# Patient Record
Sex: Female | Born: 1970 | Race: Black or African American | Hispanic: No | Marital: Married | Smoking: Never smoker
Health system: Southern US, Community
[De-identification: ages and names within clinical notes are randomized; demographics above are authoritative.]

## PROBLEM LIST (undated history)

## (undated) DIAGNOSIS — D219 Benign neoplasm of connective and other soft tissue, unspecified: Secondary | ICD-10-CM

## (undated) HISTORY — DX: Benign neoplasm of connective and other soft tissue, unspecified: D21.9

## (undated) HISTORY — PX: ABDOMINAL HYSTERECTOMY: SHX81

---

## 2009-06-13 ENCOUNTER — Encounter: Admission: RE | Admit: 2009-06-13 | Discharge: 2009-06-13 | Payer: Self-pay | Admitting: Infectious Diseases

## 2011-10-26 ENCOUNTER — Ambulatory Visit (INDEPENDENT_AMBULATORY_CARE_PROVIDER_SITE_OTHER): Payer: 59 | Admitting: Family Medicine

## 2011-10-26 VITALS — BP 121/72 | HR 80 | Temp 98.3°F | Resp 16 | Ht 65.5 in | Wt 167.0 lb

## 2011-10-26 DIAGNOSIS — J069 Acute upper respiratory infection, unspecified: Secondary | ICD-10-CM

## 2011-10-26 DIAGNOSIS — M25562 Pain in left knee: Secondary | ICD-10-CM

## 2011-10-26 DIAGNOSIS — M25561 Pain in right knee: Secondary | ICD-10-CM

## 2011-10-26 DIAGNOSIS — M25569 Pain in unspecified knee: Secondary | ICD-10-CM

## 2011-10-26 LAB — POCT CBC
Granulocyte percent: 40.7 %G (ref 37–80)
HCT, POC: 38.2 % (ref 37.7–47.9)
Hemoglobin: 11.6 g/dL — AB (ref 12.2–16.2)
Lymph, poc: 3.5 — AB (ref 0.6–3.4)
MCH, POC: 28.6 pg (ref 27–31.2)
MCHC: 30.4 g/dL — AB (ref 31.8–35.4)
MCV: 94.1 fL (ref 80–97)
MID (cbc): 0.5 (ref 0–0.9)
MPV: 8.8 fL (ref 0–99.8)
POC Granulocyte: 2.7 (ref 2–6.9)
POC LYMPH PERCENT: 52.3 %L — AB (ref 10–50)
POC MID %: 7 %M (ref 0–12)
Platelet Count, POC: 391 10*3/uL (ref 142–424)
RBC: 4.06 M/uL (ref 4.04–5.48)
RDW, POC: 13.4 %
WBC: 6.7 10*3/uL (ref 4.6–10.2)

## 2011-10-26 LAB — COMPREHENSIVE METABOLIC PANEL
ALT: <8 U/L (ref 0–35)
AST: 11 U/L (ref 0–37)
Albumin: 4.2 g/dL (ref 3.5–5.2)
Alkaline Phosphatase: 66 U/L (ref 39–117)
BUN: 7 mg/dL (ref 6–23)
CO2: 26 mEq/L (ref 19–32)
Calcium: 9 mg/dL (ref 8.4–10.5)
Chloride: 104 mEq/L (ref 96–112)
Creat: 0.71 mg/dL (ref 0.50–1.10)
Glucose, Bld: 88 mg/dL (ref 70–99)
Potassium: 4.2 mEq/L (ref 3.5–5.3)
Sodium: 138 mEq/L (ref 135–145)
Total Bilirubin: 0.5 mg/dL (ref 0.3–1.2)
Total Protein: 7.2 g/dL (ref 6.0–8.3)

## 2011-10-26 LAB — RHEUMATOID FACTOR: Rhuematoid fact SerPl-aCnc: <10 IU/mL (ref <=14)

## 2011-10-26 LAB — POCT SEDIMENTATION RATE: POCT SED RATE: 33 mm/hr — AB (ref 0–22)

## 2011-10-26 MED ORDER — AZITHROMYCIN 250 MG PO TABS: ORAL_TABLET | ORAL | Status: AC

## 2011-10-26 MED ORDER — PREDNISONE 20 MG PO TABS: ORAL_TABLET | ORAL | Status: AC

## 2011-10-26 NOTE — Progress Notes (Signed)
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 Pain is unrelieved by ibuprofen. She's had no swelling or other joint pain. She has no history of arthritis. She's had no falls or trauma. She does soothe her job involves a good amount of walking checking on doors and locks. She's had no sore throat or rash  Patient also complains about recent cough and sinus congestion. This is been going on a week. Said no fever or ear pain. Said no loss of hearing or trouble swallowing. He has no GERD symptoms  Patient does have a family history of osteoarthritis in her mother.  Objective: No acute distress The exam shows no ligamentous tenderness, swelling, effusion, erythema, or localized tenderness. She has full range of motion in her ligaments. She does have some fine crepitus.  HEENT is unremarkable Chest: Normal heart sounds, few rhonchi bilaterally with no rales  Assessment: URI and wear-and-tear kind of arthritis  1. URI, acute  azithromycin (ZITHROMAX Z-PAK) 250 MG tablet  2. Knee pain, bilateral  predniSONE (DELTASONE) 20 MG tablet, POCT CBC, POCT SEDIMENTATION RATE, Rheumatoid factor

## 2011-10-26 NOTE — Addendum Note (Signed)
Addended by: Honor Loh on: 10/26/2011 10:31 AM   Modules accepted: Orders

## 2019-06-02 ENCOUNTER — Encounter: Payer: Self-pay | Admitting: Advanced Practice Midwife

## 2019-06-02 ENCOUNTER — Other Ambulatory Visit: Payer: Self-pay

## 2019-06-02 ENCOUNTER — Ambulatory Visit (INDEPENDENT_AMBULATORY_CARE_PROVIDER_SITE_OTHER): Payer: No Typology Code available for payment source | Admitting: Advanced Practice Midwife

## 2019-06-02 ENCOUNTER — Other Ambulatory Visit (HOSPITAL_COMMUNITY)
Admission: RE | Admit: 2019-06-02 | Discharge: 2019-06-02 | Disposition: A | Discharge status: Home / Self Care | Payer: No Typology Code available for payment source | Source: Ambulatory Visit | Attending: Advanced Practice Midwife | Admitting: Advanced Practice Midwife

## 2019-06-02 VITALS — BP 130/75 | HR 86 | Temp 98.4°F | Resp 16 | Ht 65.0 in | Wt 164.0 lb

## 2019-06-02 DIAGNOSIS — N898 Other specified noninflammatory disorders of vagina: Secondary | ICD-10-CM

## 2019-06-02 DIAGNOSIS — Z90711 Acquired absence of uterus with remaining cervical stump: Secondary | ICD-10-CM | POA: Diagnosis not present

## 2019-06-02 DIAGNOSIS — R300 Dysuria: Secondary | ICD-10-CM | POA: Diagnosis not present

## 2019-06-02 DIAGNOSIS — Z01419 Encounter for gynecological examination (general) (routine) without abnormal findings: Secondary | ICD-10-CM | POA: Diagnosis not present

## 2019-06-02 NOTE — Progress Notes (Signed)
Subjective:     Barbara Brown is a 49 y.o. female here at [facility] for a routine exam.  Current complaints: onset of vaginal irritation 3-4 days ago following change in soap and laundry detergent. There is no itching, discharge, or odor, but there is burning. There is burning with urination also.  There are no other urinary or other symptoms.  She had hysterectomy 15+ years ago and reports having Pap every year with her usual Ob/Gyn.  She has never had an abnormal Pap.  Personal health questionnaire reviewed: yes.  Do you have a primary care provider? yes  Do you feel safe at home? yes Has anyone hit, slapped, or kicked you recently? no Do you feel sad, tired, or upset most days or are you mostly happy with life? mostly happy    Gynecologic History No LMP recorded. Patient has had a hysterectomy. Contraception: status post hysterectomy Last Pap: 02/2018. Results were: normal Last mammogram: 2020. Results were: normal  Obstetric History OB History  Gravida Para Term Preterm AB Living  1 1 1     1   SAB TAB Ectopic Multiple Live Births               # Outcome Date GA Lbr Len/2nd Weight Sex Delivery Anes PTL Lv  1 Term              The following portions of the patient's history were reviewed and updated as appropriate: allergies, current medications, past family history, past medical history, past social history, past surgical history and problem list.  Review of Systems Pertinent items noted in HPI and remainder of comprehensive ROS otherwise negative.    Objective:   BP 130/75   Pulse 86   Temp 98.4 F (36.9 C)   Resp 16   Ht 5\' 5"  (1.651 m)   Wt 164 lb (74.4 kg)   BMI 27.29 kg/m    VS reviewed, nursing note reviewed,  Constitutional: well developed, well nourished, no distress HEENT: normocephalic CV: normal rate Pulm/chest wall: normal effort Breast Exam:  right breast normal without mass, skin or nipple changes or axillary nodes, left breast normal without  mass, skin or nipple changes or axillary nodes Abdomen: soft Neuro: alert and oriented x 3 Skin: warm, dry Psych: affect normal Pelvic exam: Cervix pink, visually closed, without lesion, scant white creamy discharge, vaginal walls and external genitalia normal Bimanual exam: Cervix 0/long/high, firm, anterior, neg CMT, uterus nontender, nonenlarged, adnexa without tenderness, enlargement, or mass     Assessment/Plan:   1. Well woman exam with routine gynecological exam --Doing well, no concerns other than recent vaginal irritation. Not currently sexually active.  Changed vaginal wash and laundry detergent this week. Denies any hx of vaginal infections like yeast or BV x 20+ years. --Pt with Paps annually prior to today. Discussed current recommendations, with pap Q 5 years with cotesting.  Pt would llike to do Pap today since we are doing pelvic for her symptoms, then consider going longer between testing if normal results. --Pt unsure of hysterectomy hx but recurrent Pap tests make partial hysterectomy most likely.  Partial cervix visible on speculum exam but no length of cervix palpable on bimanual.  - MM DIGITAL SCREENING BILATERAL; Future - Cytology - PAP( [place])  2. Vaginal irritation --No edema, erythema, or discharge on exam --Pt to use unscented soaps, less soap in vaginal area, and may use topical hydrocortisone PRN --Vaginal swabs pending --No clinical evidence of atrophy but may be  contributing in 49 yo pt s/p hysterectomy --F/U with office if symptoms persist, consider steroid ointment if irritation without infection persists - Cervicovaginal ancillary only( [place])  3. Dysuria  - Urinalysis, Routine w reflex microscopic      Follow up in: 1 year or as needed.   Fatima Blank, CNM 4:18 PM

## 2019-06-03 LAB — URINALYSIS, ROUTINE W REFLEX MICROSCOPIC
Bacteria, UA: NONE SEEN /HPF
Bilirubin Urine: NEGATIVE
Glucose, UA: NEGATIVE
Hgb urine dipstick: NEGATIVE
Hyaline Cast: NONE SEEN /LPF
Ketones, ur: NEGATIVE
Leukocytes,Ua: NEGATIVE
Nitrite: NEGATIVE
Protein, ur: NEGATIVE
Specific Gravity, Urine: 1.023 (ref 1.001–1.03)
WBC, UA: NONE SEEN /HPF (ref 0–5)
pH: 7 (ref 5.0–8.0)

## 2019-06-03 LAB — CERVICOVAGINAL ANCILLARY ONLY
Bacterial Vaginitis (gardnerella): NEGATIVE
Candida Glabrata: NEGATIVE
Candida Vaginitis: NEGATIVE
Chlamydia: NEGATIVE
Comment: NEGATIVE
Comment: NEGATIVE
Comment: NEGATIVE
Comment: NEGATIVE
Comment: NEGATIVE
Comment: NORMAL
Neisseria Gonorrhea: NEGATIVE
Trichomonas: NEGATIVE

## 2019-06-04 LAB — CYTOLOGY - PAP
Comment: NEGATIVE
Diagnosis: NEGATIVE
High risk HPV: POSITIVE — AB

## 2019-06-06 ENCOUNTER — Encounter: Payer: Self-pay | Admitting: Advanced Practice Midwife

## 2019-06-06 DIAGNOSIS — R87810 Cervical high risk human papillomavirus (HPV) DNA test positive: Secondary | ICD-10-CM | POA: Insufficient documentation

## 2019-06-09 ENCOUNTER — Telehealth: Payer: Self-pay | Admitting: *Deleted

## 2019-06-09 NOTE — Telephone Encounter (Signed)
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

## 2019-06-10 ENCOUNTER — Other Ambulatory Visit: Payer: Self-pay

## 2019-06-10 ENCOUNTER — Ambulatory Visit (INDEPENDENT_AMBULATORY_CARE_PROVIDER_SITE_OTHER): Payer: No Typology Code available for payment source

## 2019-06-10 DIAGNOSIS — Z01419 Encounter for gynecological examination (general) (routine) without abnormal findings: Secondary | ICD-10-CM | POA: Diagnosis not present

## 2019-06-11 ENCOUNTER — Other Ambulatory Visit: Payer: Self-pay | Admitting: Advanced Practice Midwife

## 2019-06-11 DIAGNOSIS — R928 Other abnormal and inconclusive findings on diagnostic imaging of breast: Secondary | ICD-10-CM

## 2019-06-26 ENCOUNTER — Other Ambulatory Visit: Payer: No Typology Code available for payment source

## 2019-06-29 ENCOUNTER — Ambulatory Visit
Admission: RE | Admit: 2019-06-29 | Discharge: 2019-06-29 | Disposition: A | Discharge status: Home / Self Care | Payer: No Typology Code available for payment source | Source: Ambulatory Visit | Attending: Advanced Practice Midwife | Admitting: Advanced Practice Midwife

## 2019-06-29 ENCOUNTER — Other Ambulatory Visit: Payer: Self-pay

## 2019-06-29 ENCOUNTER — Other Ambulatory Visit: Payer: Self-pay | Admitting: Advanced Practice Midwife

## 2019-06-29 DIAGNOSIS — R928 Other abnormal and inconclusive findings on diagnostic imaging of breast: Secondary | ICD-10-CM

## 2019-06-30 ENCOUNTER — Ambulatory Visit
Admission: RE | Admit: 2019-06-30 | Discharge: 2019-06-30 | Disposition: A | Discharge status: Home / Self Care | Payer: No Typology Code available for payment source | Source: Ambulatory Visit | Attending: Advanced Practice Midwife | Admitting: Advanced Practice Midwife

## 2019-06-30 ENCOUNTER — Other Ambulatory Visit: Payer: Self-pay

## 2019-06-30 DIAGNOSIS — R928 Other abnormal and inconclusive findings on diagnostic imaging of breast: Secondary | ICD-10-CM

## 2021-02-19 IMAGING — MG DIGITAL SCREENING BILAT W/ TOMO W/ CAD
6 of 12 series · 6 of 36 positions shown · non-contrast
Comparison: Previous exam(s).

CLINICAL DATA: Screening.

EXAM:
DIGITAL SCREENING BILATERAL MAMMOGRAM WITH TOMO AND CAD

[R MLO synth-2D]
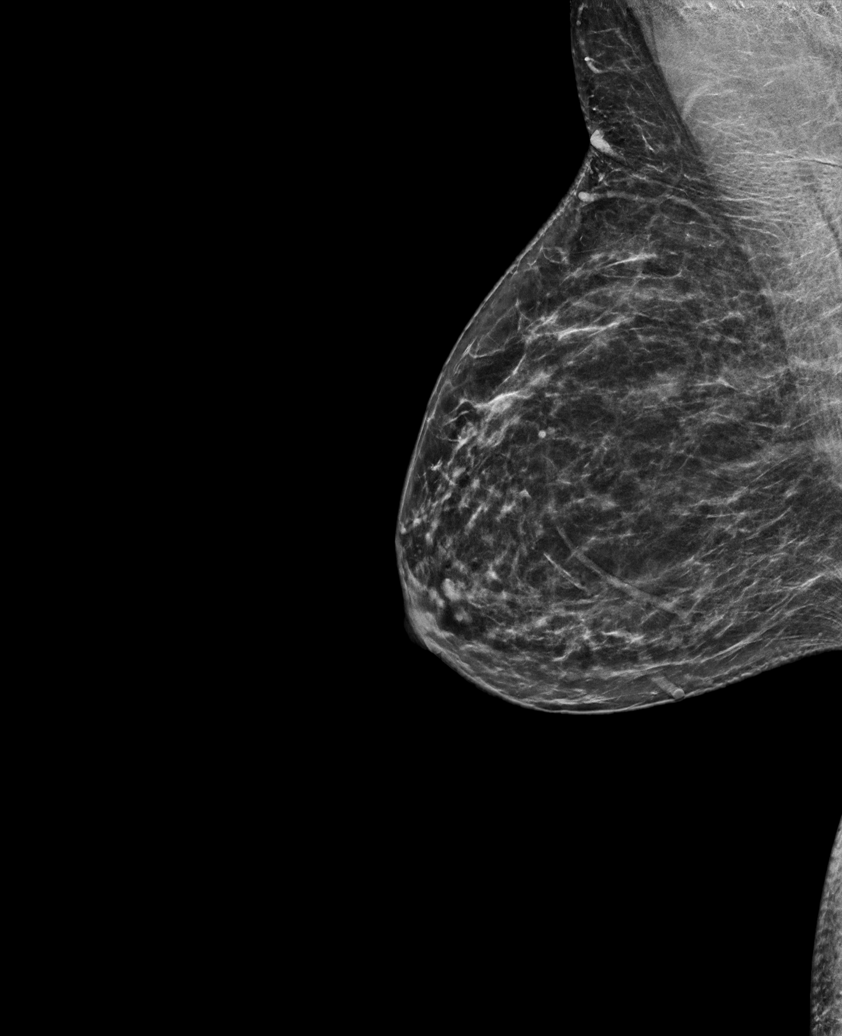

[L MLO synth-2D (1 of 2)]
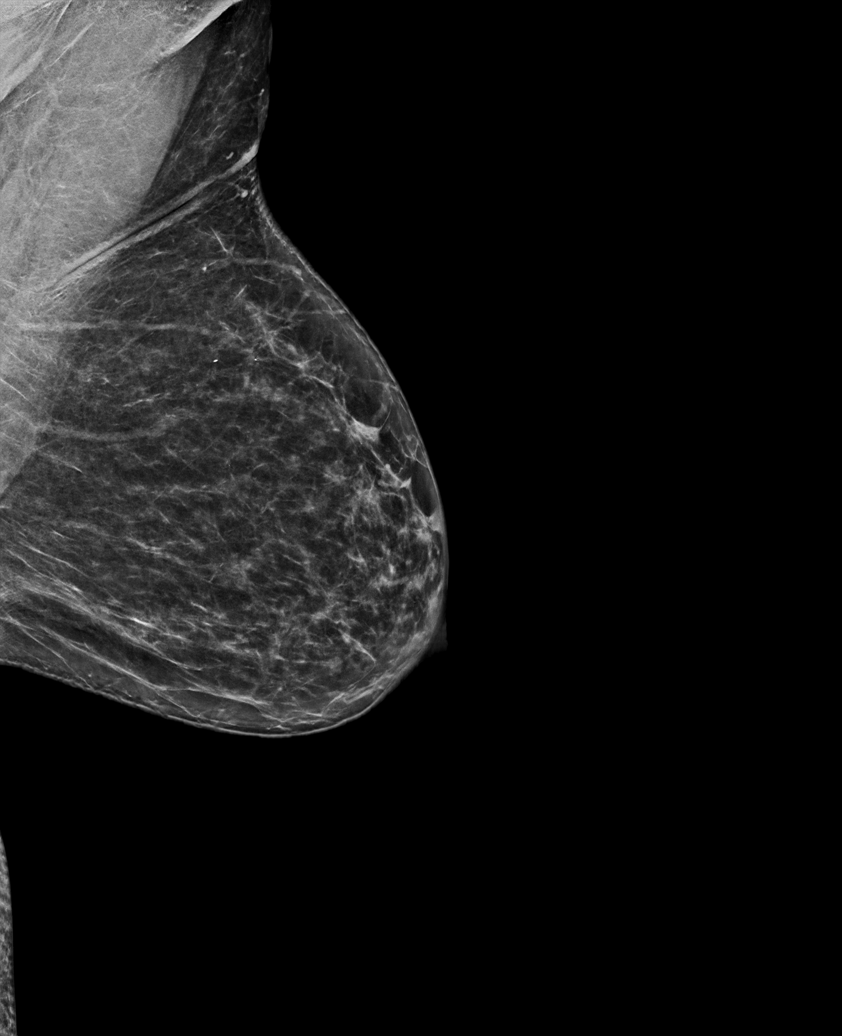

[L CC synth-2D]
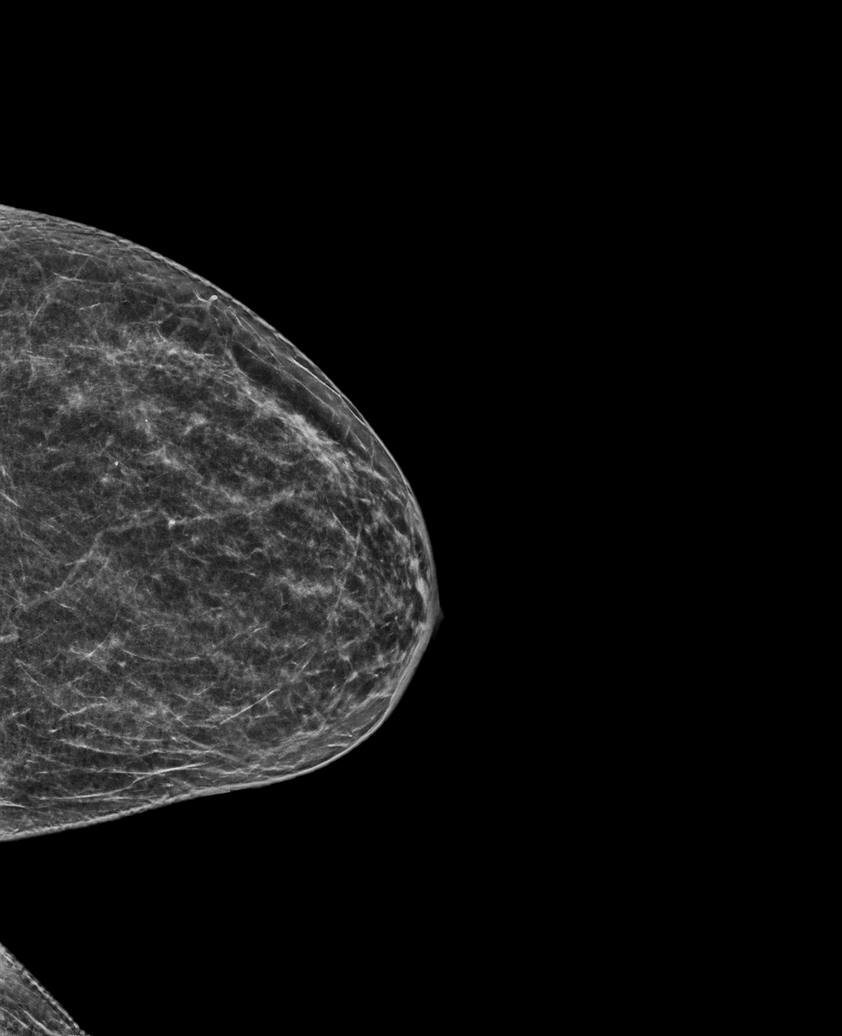

[L MLO synth-2D (2 of 2)]
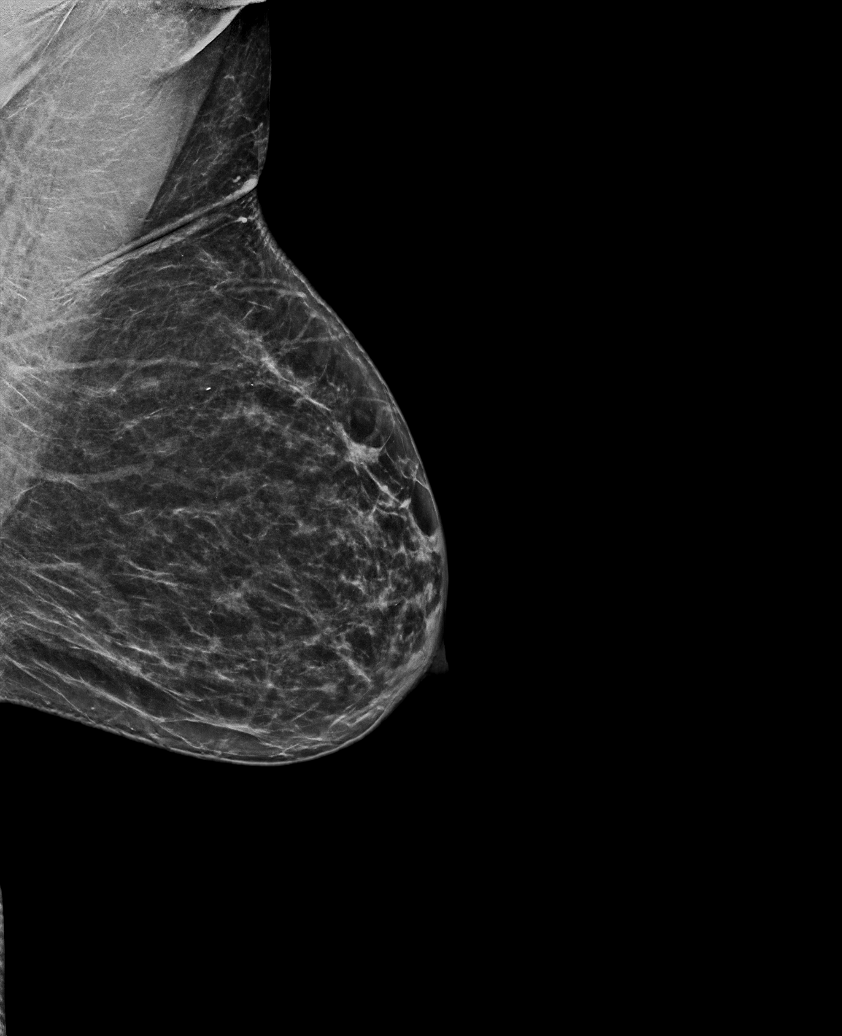

[R XCCL synth-2D]
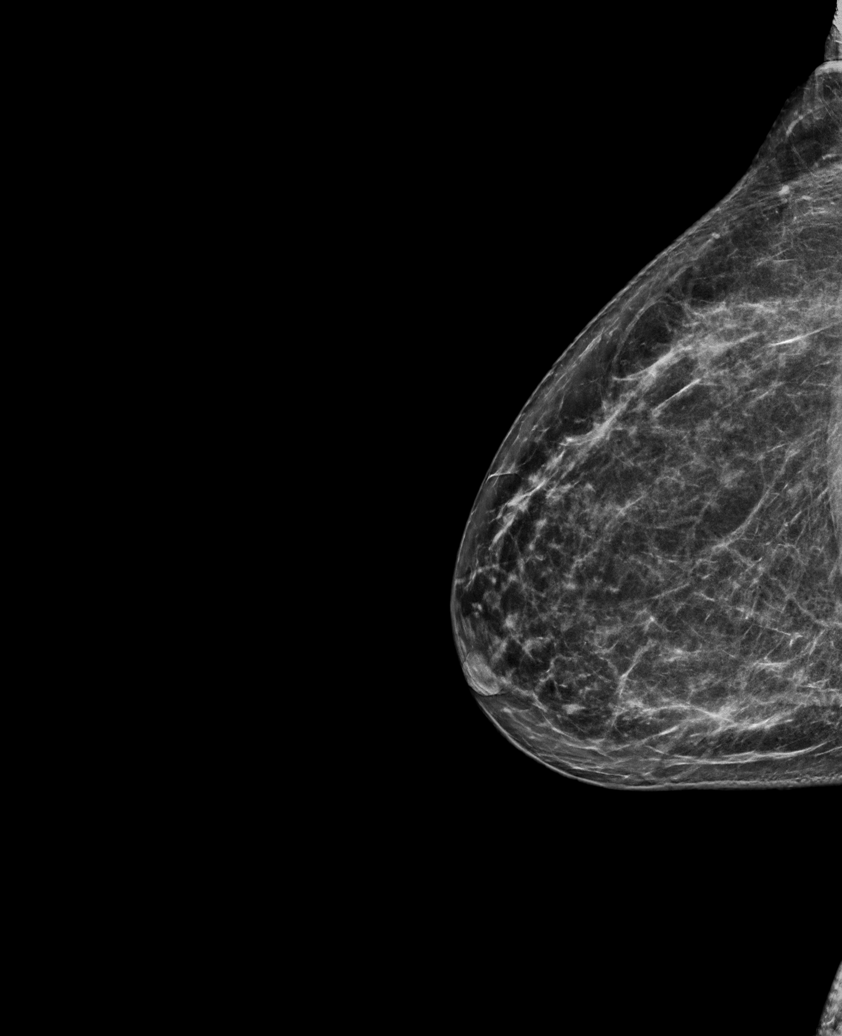

[R CC synth-2D]
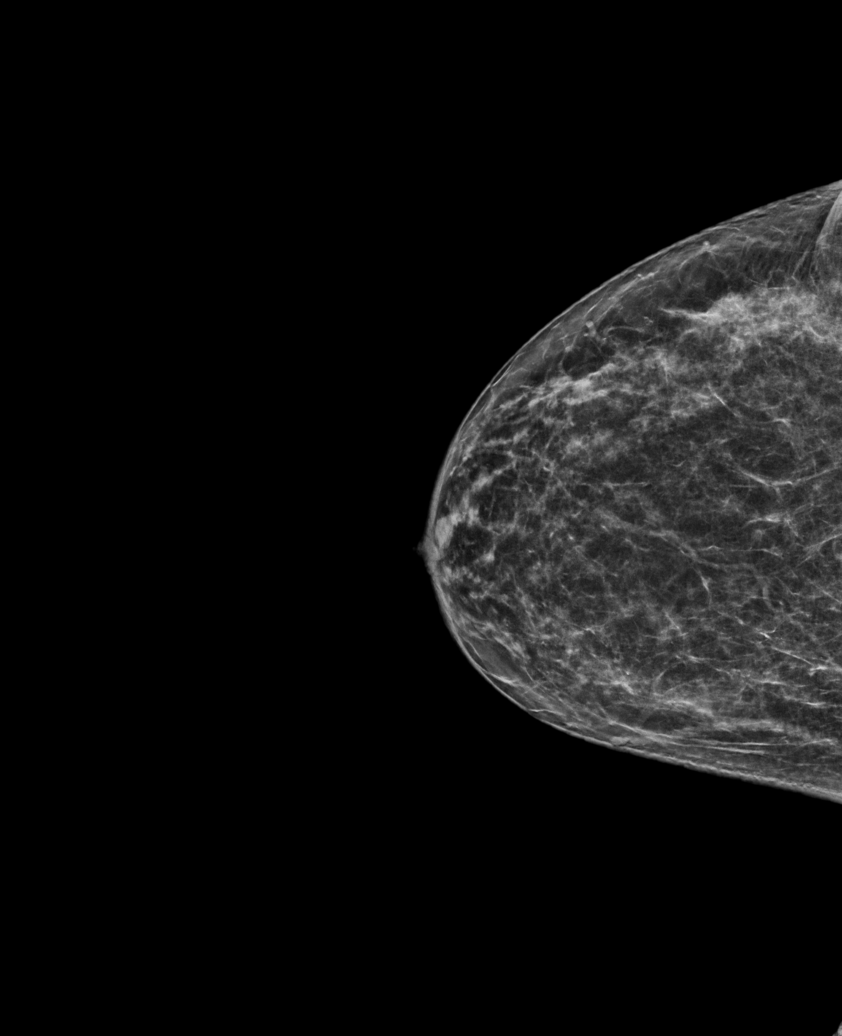

[6 of 36 positions shown; findings below may reference images not displayed]

ACR Breast Density Category b: There are scattered areas of
fibroglandular density.
FINDINGS: In the right breast, a possible mass warrants further evaluation. In
the left breast, no findings suspicious for malignancy. Images were
processed with CAD.
IMPRESSION: Further evaluation is suggested for possible mass in the right
breast.

RECOMMENDATION:
Diagnostic mammogram and possibly ultrasound of the right breast.
(Code:T1-A-550)

The patient will be contacted regarding the findings, and additional
imaging will be scheduled.

BI-RADS CATEGORY  0: Incomplete. Need additional imaging evaluation
and/or prior mammograms for comparison.

## 2022-04-12 ENCOUNTER — Encounter: Payer: Self-pay | Admitting: Family Medicine

## 2022-04-12 ENCOUNTER — Ambulatory Visit (INDEPENDENT_AMBULATORY_CARE_PROVIDER_SITE_OTHER): Payer: BC Managed Care – PPO | Admitting: Family Medicine

## 2022-04-12 ENCOUNTER — Ambulatory Visit: Payer: Self-pay

## 2022-04-12 VITALS — BP 128/68 | Ht 65.0 in | Wt 160.0 lb

## 2022-04-12 DIAGNOSIS — M23203 Derangement of unspecified medial meniscus due to old tear or injury, right knee: Secondary | ICD-10-CM | POA: Diagnosis not present

## 2022-04-12 DIAGNOSIS — M25561 Pain in right knee: Secondary | ICD-10-CM

## 2022-04-12 DIAGNOSIS — M23204 Derangement of unspecified medial meniscus due to old tear or injury, left knee: Secondary | ICD-10-CM | POA: Diagnosis not present

## 2022-04-12 MED ORDER — PREDNISONE 5 MG PO TABS: ORAL_TABLET | ORAL | 0 refills | Status: AC

## 2022-04-12 NOTE — Patient Instructions (Signed)
Nice to meet you Please use ice as needed  Please try the exercises  You can consider the compression  Please send me a message in [place] with any questions or updates.  Please see me back in 4 weeks .   --Dr. Raeford Razor

## 2022-04-12 NOTE — Progress Notes (Signed)
  Barbara Brown - 52 y.o. female MRN [id number]  Date of birth: [birth year]  SUBJECTIVE:  Including CC & ROS.  No chief complaint on file.   Barbara Brown is a 52 y.o. female that is presenting with acute on chronic bilateral knee pain.  Currently the right knee is worse than the left knee.  She reports having steroid injections done about a year ago.  No injury or inciting event.  Worse with walking..    Review of Systems See HPI   HISTORY: Past Medical, Surgical, Social, and Family History Reviewed & Updated per EMR.   Pertinent Historical Findings include:  Past Medical History:  Diagnosis Date   Fibroids     Past Surgical History:  Procedure Laterality Date   ABDOMINAL HYSTERECTOMY       PHYSICAL EXAM:  VS: BP 128/68   Ht 5' 5"$  (1.651 m)   Wt 160 lb (72.6 kg)   BMI 26.63 kg/m  Physical Exam Gen: NAD, alert, cooperative with exam, well-appearing MSK:  Neurovascularly intact    Limited ultrasound: Right and left knee pain:  Right knee: Moderate effusion the suprapatellar pouch. Normal-appearing quadricep and patellar tendon. Medial and lateral joint space narrowing with degenerative changes of the meniscus. Increased hyperemia around the synovium to suggest synovitis.  Left knee: No effusion in the suprapatellar pouch. Normal-appearing quadricep and patellar tendon. Degenerative changes of the medial lateral meniscus.   Summary: Findings consistent with right knee with effusion and synovitis and left knee with degenerative meniscal changes.  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  Currently the right knee is worse than the left knee.  Has received steroid injections in the past.  Appears that the meniscus irritation is worse on the right than the left. -Counseled on home exercise therapy and supportive care. -Prednisone. -Counseled on  compression. -Could consider injection physical therapy or further imaging

## 2022-04-12 NOTE — Assessment & Plan Note (Signed)
Acute on chronic in nature.  Currently the right knee is worse than the left knee.  Has received steroid injections in the past.  Appears that the meniscus irritation is worse on the right than the left. -Counseled on home exercise therapy and supportive care. -Prednisone. -Counseled on compression. -Could consider injection physical therapy or further imaging

## 2022-05-16 ENCOUNTER — Ambulatory Visit: Payer: BC Managed Care – PPO | Admitting: Family Medicine

## 2022-05-23 ENCOUNTER — Ambulatory Visit (HOSPITAL_BASED_OUTPATIENT_CLINIC_OR_DEPARTMENT_OTHER)
Admission: RE | Admit: 2022-05-23 | Discharge: 2022-05-23 | Disposition: A | Discharge status: Home / Self Care | Payer: BC Managed Care – PPO | Source: Ambulatory Visit | Attending: Family Medicine | Admitting: Family Medicine

## 2022-05-23 ENCOUNTER — Ambulatory Visit: Payer: BC Managed Care – PPO | Admitting: Family Medicine

## 2022-05-23 ENCOUNTER — Encounter: Payer: Self-pay | Admitting: Family Medicine

## 2022-05-23 ENCOUNTER — Other Ambulatory Visit: Payer: Self-pay

## 2022-05-23 VITALS — BP 132/80 | Ht 65.0 in | Wt 160.0 lb

## 2022-05-23 DIAGNOSIS — M23203 Derangement of unspecified medial meniscus due to old tear or injury, right knee: Secondary | ICD-10-CM | POA: Diagnosis not present

## 2022-05-23 DIAGNOSIS — M23204 Derangement of unspecified medial meniscus due to old tear or injury, left knee: Secondary | ICD-10-CM | POA: Diagnosis not present

## 2022-05-23 MED ORDER — TRIAMCINOLONE ACETONIDE 40 MG/ML IJ SUSP
40.0000 mg | Freq: Once | INTRAMUSCULAR | Status: AC
  Administered 2022-05-23: 40 mg via INTRA_ARTICULAR

## 2022-05-23 NOTE — Patient Instructions (Signed)
Good to see you Please use ice as needed  We have made a referral to physical therapy   We'll call with the xray results.  Please send me a message in MyChart with any questions or updates.  Please see me back in 4 weeks.   --Dr. Jordan Likes

## 2022-05-23 NOTE — Addendum Note (Signed)
Addended by: Merrilyn Puma on: 05/23/2022 02:14 PM   Modules accepted: Orders

## 2022-05-23 NOTE — Progress Notes (Signed)
  Barbara Brown - 52 y.o. female MRN [id number]  Date of birth: [birth year]  SUBJECTIVE:  Including CC & ROS.  No chief complaint on file.   Barbara Brown is a 52 y.o. female that is  presenting with acute worsening of her bilateral knee pain. Did get two days of improvement with the prednisone.  The pain has returned.  The right is worse than the left.   Review of Systems See HPI   HISTORY: Past Medical, Surgical, Social, and Family History Reviewed & Updated per EMR.   Pertinent Historical Findings include:  Past Medical History:  Diagnosis Date   Fibroids     Past Surgical History:  Procedure Laterality Date   ABDOMINAL HYSTERECTOMY       PHYSICAL EXAM:  VS: BP 132/80 (BP Location: Left Arm, Patient Position: Sitting)   Ht 5\' 5"  (1.651 m)   Wt 160 lb (72.6 kg)   BMI 26.63 kg/m  Physical Exam Gen: NAD, alert, cooperative with exam, well-appearing MSK:  Neurovascularly intact     Aspiration/Injection Procedure Note Jasmie Andrews 05-17-1970  Procedure: Injection Indications: Right knee pain  Procedure Details Consent: Risks of procedure as well as the alternatives and risks of each were explained to the (patient/caregiver).  Consent for procedure obtained. Time Out: Verified patient identification, verified procedure, site/side was marked, verified correct patient position, special equipment/implants available, medications/allergies/relevent history reviewed, required imaging and test results available.  Performed.  The area was cleaned with iodine and alcohol swabs.    The right knee superior lateral suprapatellar pouch was injected using 4 cc of 1% lidocaine and 0.4 cc of 8.4% sodium bicarbonate on a 22-gauge 1-1/2 inch needle.  The syringe was switched to mixture containing 1 cc's of 40 mg Kenalog and 4 cc's of 0.25% bupivacaine was injected.  Ultrasound was used. Images were obtained in long views showing the injection.     A sterile dressing was  applied.  Patient did tolerate procedure well.   Aspiration/Injection Procedure Note Aanika Tauscher 03/05/1970  Procedure: Injection Indications: Left knee pain  Procedure Details Consent: Risks of procedure as well as the alternatives and risks of each were explained to the (patient/caregiver).  Consent for procedure obtained. Time Out: Verified patient identification, verified procedure, site/side was marked, verified correct patient position, special equipment/implants available, medications/allergies/relevent history reviewed, required imaging and test results available.  Performed.  The area was cleaned with iodine and alcohol swabs.    The left knee superior lateral suprapatellar pouch was injected using 4 cc of 1% lidocaine and 0.4 cc of 8.4% sodium bicarbonate on a 22-gauge 1-1/2 inch needle.  The syringe was switched to mixture containing 1 cc's of 40 mg Kenalog and 4 cc's of 0.25% bupivacaine was injected.  Ultrasound was used. Images were obtained in long views showing the injection.     A sterile dressing was applied.  Patient did tolerate procedure well.     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  Has a significant effusion on the right.  Left has a mild effusion. -Counseled on home exercise therapy and supportive care. -Bilateral injection today. -Bilateral x-ray. -Referral to physical therapy - could consider gel injections.

## 2022-05-23 NOTE — Assessment & Plan Note (Signed)
Acute on chronic in nature.  Has a significant effusion on the right.  Left has a mild effusion. -Counseled on home exercise therapy and supportive care. -Bilateral injection today. -Bilateral x-ray. -Referral to physical therapy - could consider gel injections.

## 2022-05-25 ENCOUNTER — Telehealth: Payer: Self-pay | Admitting: Family Medicine

## 2022-05-25 NOTE — Telephone Encounter (Signed)
Informed of results.   Myra Rude, MD [facility] 05/25/2022, 10:00 AM

## 2022-05-28 ENCOUNTER — Encounter: Payer: Self-pay | Admitting: *Deleted

## 2022-06-21 ENCOUNTER — Ambulatory Visit: Payer: BC Managed Care – PPO | Admitting: Family Medicine
